# Patient Record
Sex: Female | Born: 1960 | Race: White | Hispanic: No | Smoking: Never smoker
Health system: Southern US, Community
[De-identification: ages and names within clinical notes are randomized; demographics above are authoritative.]

## PROBLEM LIST (undated history)

## (undated) DIAGNOSIS — M1990 Unspecified osteoarthritis, unspecified site: Secondary | ICD-10-CM

## (undated) DIAGNOSIS — C4491 Basal cell carcinoma of skin, unspecified: Secondary | ICD-10-CM

## (undated) DIAGNOSIS — Z889 Allergy status to unspecified drugs, medicaments and biological substances status: Secondary | ICD-10-CM

## (undated) DIAGNOSIS — Z8739 Personal history of other diseases of the musculoskeletal system and connective tissue: Secondary | ICD-10-CM

## (undated) DIAGNOSIS — E785 Hyperlipidemia, unspecified: Secondary | ICD-10-CM

## (undated) HISTORY — DX: Basal cell carcinoma of skin, unspecified: C44.91

## (undated) HISTORY — DX: Personal history of other diseases of the musculoskeletal system and connective tissue: Z87.39

## (undated) HISTORY — DX: Unspecified osteoarthritis, unspecified site: M19.90

## (undated) HISTORY — DX: Allergy status to unspecified drugs, medicaments and biological substances: Z88.9

## (undated) HISTORY — DX: Hyperlipidemia, unspecified: E78.5

---
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
## 1997-11-29 HISTORY — PX: BACK SURGERY: SHX140

## 1998-08-07 ENCOUNTER — Inpatient Hospital Stay (HOSPITAL_COMMUNITY): Admission: RE | Admit: 1998-08-07 | Discharge: 1998-08-08 | Payer: Self-pay | Admitting: Neurosurgery

## 1998-08-07 ENCOUNTER — Encounter: Payer: Self-pay | Admitting: Neurosurgery

## 2000-09-07 ENCOUNTER — Other Ambulatory Visit: Admission: RE | Admit: 2000-09-07 | Discharge: 2000-09-07 | Payer: Self-pay | Admitting: *Deleted

## 2001-11-15 ENCOUNTER — Other Ambulatory Visit: Admission: RE | Admit: 2001-11-15 | Discharge: 2001-11-15 | Payer: Self-pay | Admitting: *Deleted

## 2002-11-19 ENCOUNTER — Other Ambulatory Visit: Admission: RE | Admit: 2002-11-19 | Discharge: 2002-11-19 | Payer: Self-pay | Admitting: *Deleted

## 2004-07-20 ENCOUNTER — Other Ambulatory Visit: Admission: RE | Admit: 2004-07-20 | Discharge: 2004-07-20 | Payer: Self-pay | Admitting: Obstetrics and Gynecology

## 2005-08-06 ENCOUNTER — Other Ambulatory Visit: Admission: RE | Admit: 2005-08-06 | Discharge: 2005-08-06 | Payer: Self-pay | Admitting: Obstetrics and Gynecology

## 2007-11-30 HISTORY — PX: ABLATION: SHX5711

## 2011-11-30 HISTORY — PX: OTHER SURGICAL HISTORY: SHX169

## 2015-04-09 ENCOUNTER — Other Ambulatory Visit: Payer: Self-pay

## 2015-04-09 ENCOUNTER — Other Ambulatory Visit: Payer: Self-pay | Admitting: Obstetrics and Gynecology

## 2015-04-09 DIAGNOSIS — M25471 Effusion, right ankle: Secondary | ICD-10-CM

## 2015-04-09 DIAGNOSIS — M7989 Other specified soft tissue disorders: Secondary | ICD-10-CM

## 2015-04-10 ENCOUNTER — Ambulatory Visit
Admission: RE | Admit: 2015-04-10 | Discharge: 2015-04-10 | Disposition: A | Discharge status: Home / Self Care | Payer: 59 | Source: Ambulatory Visit | Attending: Obstetrics and Gynecology | Admitting: Obstetrics and Gynecology

## 2015-04-10 DIAGNOSIS — M7989 Other specified soft tissue disorders: Secondary | ICD-10-CM

## 2015-04-10 DIAGNOSIS — M25471 Effusion, right ankle: Secondary | ICD-10-CM

## 2015-08-12 ENCOUNTER — Other Ambulatory Visit: Payer: Self-pay | Admitting: Physician Assistant

## 2015-08-12 DIAGNOSIS — R600 Localized edema: Secondary | ICD-10-CM

## 2015-08-14 ENCOUNTER — Ambulatory Visit
Admission: RE | Admit: 2015-08-14 | Discharge: 2015-08-14 | Disposition: A | Discharge status: Home / Self Care | Payer: 59 | Source: Ambulatory Visit | Attending: Physician Assistant | Admitting: Physician Assistant

## 2015-08-14 DIAGNOSIS — R600 Localized edema: Secondary | ICD-10-CM

## 2015-08-14 MED ORDER — IOPAMIDOL (ISOVUE-300) INJECTION 61%
100.0000 mL | Freq: Once | INTRAVENOUS | Status: AC | PRN
  Administered 2015-08-14: 125 mL via INTRAVENOUS

## 2017-03-29 DIAGNOSIS — C4491 Basal cell carcinoma of skin, unspecified: Secondary | ICD-10-CM

## 2017-03-29 HISTORY — PX: BASAL CELL CARCINOMA EXCISION: SHX1214

## 2017-03-29 HISTORY — DX: Basal cell carcinoma of skin, unspecified: C44.91

## 2017-04-13 ENCOUNTER — Ambulatory Visit
Admission: RE | Admit: 2017-04-13 | Discharge: 2017-04-13 | Disposition: A | Discharge status: Home / Self Care | Payer: 59 | Source: Ambulatory Visit | Attending: Family Medicine | Admitting: Family Medicine

## 2017-04-13 ENCOUNTER — Other Ambulatory Visit: Payer: Self-pay | Admitting: Family Medicine

## 2017-04-13 DIAGNOSIS — R059 Cough, unspecified: Secondary | ICD-10-CM

## 2017-04-13 DIAGNOSIS — R05 Cough: Secondary | ICD-10-CM

## 2017-06-17 ENCOUNTER — Telehealth: Payer: Self-pay | Admitting: Emergency Medicine

## 2017-06-17 NOTE — Telephone Encounter (Signed)
Left message for Danielle Rivera to call back.

## 2017-06-20 NOTE — Telephone Encounter (Signed)
Message in error -pr

## 2017-06-29 ENCOUNTER — Ambulatory Visit (INDEPENDENT_AMBULATORY_CARE_PROVIDER_SITE_OTHER): Payer: 59 | Admitting: Pulmonary Disease

## 2017-06-29 ENCOUNTER — Encounter: Payer: Self-pay | Admitting: Pulmonary Disease

## 2017-06-29 VITALS — BP 142/88 | HR 61 | Ht 61.0 in | Wt 255.0 lb

## 2017-06-29 DIAGNOSIS — R05 Cough: Secondary | ICD-10-CM

## 2017-06-29 DIAGNOSIS — Z6841 Body Mass Index (BMI) 40.0 and over, adult: Secondary | ICD-10-CM

## 2017-06-29 DIAGNOSIS — R053 Chronic cough: Secondary | ICD-10-CM

## 2017-06-29 DIAGNOSIS — R058 Other specified cough: Secondary | ICD-10-CM

## 2017-06-29 DIAGNOSIS — R29818 Other symptoms and signs involving the nervous system: Secondary | ICD-10-CM

## 2017-06-29 MED ORDER — FLUTICASONE PROPIONATE 50 MCG/ACT NA SUSP: 1.0000 | Freq: Every day | NASAL | 2 refills | Status: DC

## 2017-06-29 MED ORDER — CHLORPHENIRAMINE MALEATE 4 MG PO TABS: 4.0000 mg | ORAL_TABLET | Freq: Every day | ORAL | 0 refills | Status: DC

## 2017-06-29 NOTE — Progress Notes (Signed)
Past surgical history She  has a past surgical history that includes Back surgery (1999); Tonsillectomy (1970); Excision basal cell carcinoma (03/2017); Tummy Tuck (2013); and Ablation (2009).  Family history Her family history includes Breast cancer in her cousin; Diabetes in her mother; Heart disease in her maternal grandmother; Hypertension in her father and mother; Uterine cancer in her mother.  Social history She  reports that she has never smoked. She has never used smokeless tobacco. She reports that she drinks alcohol. She reports that she does not use drugs.  Allergies  Allergen Reactions  . Codeine Nausea Only   Review of Systems  Constitutional: Positive for unexpected weight change. Negative for fever.  HENT: Positive for congestion. Negative for dental problem, ear pain, nosebleeds, postnasal drip, rhinorrhea, sinus pressure, sneezing, sore throat and trouble swallowing.   Eyes: Negative for redness and itching.  Respiratory: Positive for cough and shortness of breath. Negative for chest tightness and wheezing.   Cardiovascular: Negative for palpitations and leg swelling.  Gastrointestinal: Negative for nausea and vomiting.  Genitourinary: Negative for dysuria.  Musculoskeletal: Negative for joint swelling.  Skin: Negative for rash.  Neurological: Positive for headaches.  Hematological: Does not bruise/bleed easily.  Psychiatric/Behavioral: Negative for dysphoric mood. The patient is not nervous/anxious.    No current outpatient prescriptions on file prior to visit.   No current facility-administered medications on file prior to visit.     Chief Complaint  Patient presents with  . PULMONARY CONSULT    Referred by Dr Marisue Humble for cough. Cough present x 6 months, pt reports cough being mostly dry with little mucus     Past medical history She  has a past medical history of Basal cell carcinoma (03/2017); H/O seasonal allergies; and Hyperlipidemia.  Vital signs BP  (!) 142/88 (BP Location: Left Arm, Cuff Size: Normal)   Pulse 61   Ht 5\' 1"  (1.549 m)   Wt 255 lb (115.7 kg)   SpO2 95%   BMI 48.18 kg/m   History of present illness Danielle Rivera is a 56 y.o. female never smoker with chronic cough.  Her symptoms started about 6 months ago.  She isn't aware of any particular inciting event.  She has been getting post nasal drip.  She gets throat irritation and feels like something is stuck in her throat.  She tries to clear her throat or cough, but nothing will come up.  She does get allergies with change of seasons.  She has not had allergy testing before.  She was never told that she had asthma.  She was tried on an albuterol inhaler, but this didn't help.  She was tried on reflux therapy, but no improvement.  She hasn't been tried on nasal sprays.  She did try an OTC antihistamine and this helped some.  She has not had breathing tests before.  She did have her blood pressure medication changed recently (?ACE inhibitor), and she feels her cough and throat irritation have improved some since this was stopped.  She gets wheezing, but this comes from her upper chest and throat area.  She has noticed trouble with her breathing while asleep.  She snores, and her boyfriend has told her that her breathing cuts off at times while she is asleep.  She has trouble staying awake when watching TV.  She is a restless sleeper.  Her Epworth sleepiness score is 10 out of 24.  Physical exam  General - No distress Eyes - pupils reactive ENT - No  sinus tenderness, no oral exudate, no LAN, no thyromegaly, TM clear, pupils equal/reactive, raspy voice, frequent throat clearing, MP 4, scalloped tongue Cardiac - s1s2 regular, no murmur, pulses symmetric Chest - No wheeze/rales/dullness, good air entry, normal respiratory excursion Back - No focal tenderness Abd - Soft, non-tender, no organomegaly, + bowel sounds Ext - No edema Neuro - Normal strength, cranial nerves intact Skin  - No rashes Psych - Normal mood, and behavior   CXR 04/13/17 >> no active disease   Discussion She has persistent cough.  She is a never smoker and had normal chest xray in May 2018.  She has been tried on asthma therapy and antireflux therapy w/o improvement.  She has symptoms of sinus congestion and post nasal drip, and likely has upper airway cough syndrome as a result of this.  She also reports having some improvement in her cough after her blood pressure medication was stopped >> ?if this was an ACE inhibitor.  Finally, she has snoring, sleep disruption, witnessed apnea, and daytime sleepiness.  She could have obstructive sleep apnea.  I explained how sleep apnea could contribute throat irritation and nocturnal cough.  We also discussed how sleep apnea can affect various health problems, including risks for hypertension, cardiovascular disease, and diabetes.  We also discussed how sleep disruption can increase risks for accidents, such as while driving.  Weight loss as a means of improving sleep apnea was also reviewed.   Assessment/plan  Upper airway cough syndrome. - nasal irrigation followed by flonase daily - chlorpheniramine 4 mg nightly - salt water gargles bid - sip water with urge to cough - sugarless candy to keep mouth moist - avoid forcing cough, or clearing throat - voice rest - will arrange for pulmonary function testing - albuterol prn  ?ACE inhibitor cough. - explained that we need at least 4 to 6 weeks off medication to determine how much this could be contributing  Suspected sleep apnea. - will discuss further at next visit about arranging for home sleep study  Obesity. - discussed importance of weight loss   Patient Instructions  Saline nasal spray daily Flonase one spray each nostril daily Chlorpheniramine 4 mg pill nightly until cough is better, then as needed Salt water gargle twice per day Sip water when you have urge to cough Use sugarless candy to  keep your mouth moist Avoid forcing cough or clearing your throat Will arrange for pulmonary function testing  Follow up in 4 weeks with Dr. Halford Chessman or Nurse Practitioner   Chesley Mires, MD [place] Pager:  [phone] 06/29/2017, 12:39 PM

## 2017-06-29 NOTE — Progress Notes (Signed)
   Subjective:    Patient ID: Danielle Rivera, female    DOB: 26-Jul-1961, 56 y.o.   MRN: [id number]  HPI    Review of Systems  Constitutional: Positive for unexpected weight change. Negative for fever.  HENT: Positive for congestion. Negative for dental problem, ear pain, nosebleeds, postnasal drip, rhinorrhea, sinus pressure, sneezing, sore throat and trouble swallowing.   Eyes: Negative for redness and itching.  Respiratory: Positive for cough and shortness of breath. Negative for chest tightness and wheezing.   Cardiovascular: Negative for palpitations and leg swelling.  Gastrointestinal: Negative for nausea and vomiting.  Genitourinary: Negative for dysuria.  Musculoskeletal: Negative for joint swelling.  Skin: Negative for rash.  Neurological: Positive for headaches.  Hematological: Does not bruise/bleed easily.  Psychiatric/Behavioral: Negative for dysphoric mood. The patient is not nervous/anxious.        Objective:   Physical Exam        Assessment & Plan:

## 2017-06-29 NOTE — Patient Instructions (Signed)
Saline nasal spray daily Flonase one spray each nostril daily Chlorpheniramine 4 mg pill nightly until cough is better, then as needed Salt water gargle twice per day Sip water when you have urge to cough Use sugarless candy to keep your mouth moist Avoid forcing cough or clearing your throat Will arrange for pulmonary function testing  Follow up in 4 weeks with Dr. Halford Chessman or Nurse Practitioner

## 2017-08-04 ENCOUNTER — Encounter: Payer: Self-pay | Admitting: Adult Health

## 2017-08-04 ENCOUNTER — Ambulatory Visit (INDEPENDENT_AMBULATORY_CARE_PROVIDER_SITE_OTHER): Payer: 59 | Admitting: Pulmonary Disease

## 2017-08-04 ENCOUNTER — Ambulatory Visit (INDEPENDENT_AMBULATORY_CARE_PROVIDER_SITE_OTHER): Payer: 59 | Admitting: Adult Health

## 2017-08-04 VITALS — BP 116/78 | HR 77 | Ht 61.5 in | Wt 252.0 lb

## 2017-08-04 DIAGNOSIS — G4733 Obstructive sleep apnea (adult) (pediatric): Secondary | ICD-10-CM

## 2017-08-04 DIAGNOSIS — R05 Cough: Secondary | ICD-10-CM

## 2017-08-04 DIAGNOSIS — R053 Chronic cough: Secondary | ICD-10-CM

## 2017-08-04 DIAGNOSIS — R400 Somnolence: Secondary | ICD-10-CM | POA: Diagnosis not present

## 2017-08-04 LAB — PULMONARY FUNCTION TEST
DL/VA % PRED: 132 %
DL/VA: 5.92 ml/min/mmHg/L
DLCO cor % pred: 105 %
DLCO cor: 22.07 ml/min/mmHg
DLCO unc % pred: 112 %
DLCO unc: 23.43 ml/min/mmHg
FEF 25-75 Post: 4.08 L/sec
FEF 25-75 Pre: 3.32 L/sec
FEF2575-%Change-Post: 22 %
FEF2575-%PRED-POST: 169 %
FEF2575-%Pred-Pre: 138 %
FEV1-%CHANGE-POST: 8 %
FEV1-%PRED-PRE: 87 %
FEV1-%Pred-Post: 94 %
FEV1-PRE: 2.13 L
FEV1-Post: 2.31 L
FEV1FVC-%Change-Post: 8 %
FEV1FVC-%PRED-PRE: 109 %
FEV6-%Change-Post: 0 %
FEV6-%PRED-POST: 81 %
FEV6-%PRED-PRE: 81 %
FEV6-POST: 2.46 L
FEV6-PRE: 2.46 L
FEV6FVC-%PRED-POST: 103 %
FEV6FVC-%PRED-PRE: 103 %
FVC-%CHANGE-POST: 0 %
FVC-%PRED-PRE: 78 %
FVC-%Pred-Post: 78 %
FVC-POST: 2.46 L
FVC-PRE: 2.46 L
PRE FEV6/FVC RATIO: 100 %
Post FEV1/FVC ratio: 94 %
Post FEV6/FVC ratio: 100 %
Pre FEV1/FVC ratio: 87 %
RV % PRED: 77 %
RV: 1.38 L
TLC % pred: 85 %
TLC: 4.02 L

## 2017-08-04 MED ORDER — BENZONATATE 200 MG PO CAPS: 200.0000 mg | ORAL_CAPSULE | Freq: Three times a day (TID) | ORAL | 3 refills | Status: DC | PRN

## 2017-08-04 NOTE — Progress Notes (Signed)
@Patient  ID: Danielle Rivera, female    DOB: [birth year], 56 y.o.   MRN: [id number]  Chief Complaint  Patient presents with  . Follow-up    Cough    Referring provider: Mayra Neer, MD  HPI: 56 year old female never smoker seen for pulmonary consult 06/29/2017 for chronic cough 6 month and daytime sleepiness  08/04/2017 Follow up : Cough /Daytime sleepiness  Patient presents for a one-month follow-up. Patient was seen last visit for persistent cough 6 months. She was changed off of her ACE inhibitor prior to her visit. Patient was recommended to use Chlor-Trimeton at bedtime. Begin Flonase daily. She underwent pulmonary function test today that showed normal lung function with an FEV1 at 94%, ratio 94, FVC 78%, DLCO 112%. No significant bronchodilator response. Chest x-ray showed clear lungs in May 2018 Since last visit. Patient feels that her cough is not much better. Taking Chlortrimeton daily . Uses Flonase occasionally does not like it running down her throat.  Has throat clearing constantly . Not using anything for cough .  Prior to consult she took 2 courses of prednisone and 4 weeks of Prilosec. Tried inhaler in past as well with no perceived benefit.   She does have daytime sleepiness ,snoring , and interrupted sleep . We discussed possible OSA . She agrees to proceed with sleep study .      Allergies  Allergen Reactions  . Codeine Nausea Only    Immunization History  Administered Date(s) Administered  . Influenza,inj,Quad PF,6+ Mos 08/29/2016    Past Medical History:  Diagnosis Date  . Basal cell carcinoma 03/2017  . H/O seasonal allergies   . Hyperlipidemia     Tobacco History: History  Smoking Status  . Never Smoker  Smokeless Tobacco  . Never Used   Counseling given: Not Answered   Outpatient Encounter Prescriptions as of 08/04/2017  Medication Sig  . amLODipine (NORVASC) 5 MG tablet Take 5 mg by mouth daily.  . chlorpheniramine (CHLOR-TRIMETON) 4  MG tablet Take 1 tablet (4 mg total) by mouth at bedtime.  . fluticasone (FLONASE) 50 MCG/ACT nasal spray Place 1 spray into both nostrils daily.  Marland Kitchen lovastatin (MEVACOR) 20 MG tablet Take 20 mg by mouth at bedtime.  . mirabegron ER (MYRBETRIQ) 50 MG TB24 tablet Take 50 mg by mouth daily.  Marland Kitchen oxybutynin (DITROPAN) 5 MG tablet Take 1 tablet by mouth 2 (two) times daily.   Marland Kitchen triamterene-hydrochlorothiazide (DYAZIDE) 37.5-25 MG capsule Take 1 capsule by mouth daily.  . benzonatate (TESSALON) 200 MG capsule Take 1 capsule (200 mg total) by mouth 3 (three) times daily as needed for cough.  Marland Kitchen omeprazole (PRILOSEC) 20 MG capsule Take 1 capsule by mouth daily.   No facility-administered encounter medications on file as of 08/04/2017.      Review of Systems  Constitutional:   No  weight loss, night sweats,  Fevers, chills, + fatigue, or  lassitude.  HEENT:   No headaches,  Difficulty swallowing,  Tooth/dental problems, or  Sore throat,                No sneezing, itching, ear ache, +nasal congestion, post nasal drip,   CV:  No chest pain,  Orthopnea, PND, swelling in lower extremities, anasarca, dizziness, palpitations, syncope.   GI  No heartburn, indigestion, abdominal pain, nausea, vomiting, diarrhea, change in bowel habits, loss of appetite, bloody stools.   Resp:   No chest wall deformity  Skin: no rash or lesions.  GU: no dysuria, change  in color of urine, no urgency or frequency.  No flank pain, no hematuria   MS:  No joint pain or swelling.  No decreased range of motion.  No back pain.    Physical Exam  BP 116/78 (BP Location: Left Arm)   Pulse 77   Ht 5' 1.5" (1.562 m)   Wt 252 lb (114.3 kg)   SpO2 95%   BMI 46.84 kg/m   GEN: A/Ox3; pleasant , NAD, obese    HEENT:  [place]/AT,  EACs-clear, TMs-wnl, NOSE-clear, THROAT-clear, no lesions, no postnasal drip or exudate noted. Class 2-3 MP airway   NECK:  Supple w/ fair ROM; no JVD; normal carotid impulses w/o bruits; no  thyromegaly or nodules palpated; no lymphadenopathy.    RESP  Clear  P & A; w/o, wheezes/ rales/ or rhonchi. no accessory muscle use, no dullness to percussion  CARD:  RRR, no m/r/g, no peripheral edema, pulses intact, no cyanosis or clubbing.  GI:   Soft & nt; nml bowel sounds; no organomegaly or masses detected.   Musco: Warm bil, no deformities or joint swelling noted.   Neuro: alert, no focal deficits noted.    Skin: Warm, no lesions or rashes    Lab Results:  CBC No results found for: WBC, RBC, HGB, HCT, PLT, MCV, MCH, MCHC, RDW, LYMPHSABS, MONOABS, EOSABS, BASOSABS  BMET No results found for: NA, K, CL, CO2, GLUCOSE, BUN, CREATININE, CALCIUM, GFRNONAA, GFRAA  BNP No results found for: BNP  ProBNP No results found for: PROBNP  Imaging: No results found.   Assessment & Plan:   Chronic cough Cyclical cough x 6 months with neg CXR , normal PFT  ? ACE related cough with AR/GERD triggers.  Some improvement off ACE but persistent dry cough and throat clearing . Will treat for triggers along with cough control meds.   Plan  . Patient Instructions  Begin Delsym 2 tsp Twice daily  For cough , As needed   Begin Tessalon Three times a day  For cough , As needed   Begin Zyrtec 10mg  daily .  Continue on Chlortrimeton 4mg  At bedtime.  Begin Prilosec 20mg  .daily before meal .  Begin Pepcid 20mg  At bedtime  .  Work on not coughing or throat clearing , use sips of water to soothe throat .  Do not use MINTS.  Set up Home Sleep Study .  Follow up Dr. Halford Chessman  In 6-8 weeks and As needed   Please contact office for sooner follow up if symptoms do not improve or worsen or seek emergency care           Daytime sleepiness Daytime hypersolmonence - snoring and obesity  Will check Home sleep study to r/o OSA     Rexene Edison, NP 08/04/2017

## 2017-08-04 NOTE — Assessment & Plan Note (Signed)
Cyclical cough x 6 months with neg CXR , normal PFT  ? ACE related cough with AR/GERD triggers.  Some improvement off ACE but persistent dry cough and throat clearing . Will treat for triggers along with cough control meds.   Plan  . Patient Instructions  Begin Delsym 2 tsp Twice daily  For cough , As needed   Begin Tessalon Three times a day  For cough , As needed   Begin Zyrtec 10mg  daily .  Continue on Chlortrimeton 4mg  At bedtime.  Begin Prilosec 20mg  .daily before meal .  Begin Pepcid 20mg  At bedtime  .  Work on not coughing or throat clearing , use sips of water to soothe throat .  Do not use MINTS.  Set up Home Sleep Study .  Follow up Dr. Halford Chessman  In 6-8 weeks and As needed   Please contact office for sooner follow up if symptoms do not improve or worsen or seek emergency care

## 2017-08-04 NOTE — Patient Instructions (Addendum)
Begin Delsym 2 tsp Twice daily  For cough , As needed   Begin Tessalon Three times a day  For cough , As needed   Begin Zyrtec 10mg  daily .  Continue on Chlortrimeton 4mg  At bedtime.  Begin Prilosec 20mg  .daily before meal .  Begin Pepcid 20mg  At bedtime  .  Work on not coughing or throat clearing , use sips of water to soothe throat .  Do not use MINTS.  Set up Home Sleep Study .  Follow up Dr. Halford Chessman  In 6-8 weeks and As needed   Please contact office for sooner follow up if symptoms do not improve or worsen or seek emergency care

## 2017-08-04 NOTE — Assessment & Plan Note (Signed)
Daytime hypersolmonence - snoring and obesity  Will check Home sleep study to r/o OSA

## 2017-08-04 NOTE — Progress Notes (Signed)
PFT done today. 

## 2017-08-05 NOTE — Progress Notes (Signed)
I have reviewed and agree with assessment/plan.  Chesley Mires, MD [facility] Pulmonary/Critical Care 08/05/2017, 11:49 AM Pager:  [phone]

## 2017-08-10 ENCOUNTER — Institutional Professional Consult (permissible substitution): Payer: 59 | Admitting: Emergency Medicine

## 2018-05-22 ENCOUNTER — Other Ambulatory Visit: Payer: Self-pay | Admitting: Physician Assistant

## 2018-05-22 DIAGNOSIS — N6310 Unspecified lump in the right breast, unspecified quadrant: Secondary | ICD-10-CM

## 2018-05-26 ENCOUNTER — Ambulatory Visit: Payer: 59

## 2018-05-26 ENCOUNTER — Ambulatory Visit
Admission: RE | Admit: 2018-05-26 | Discharge: 2018-05-26 | Disposition: A | Discharge status: Home / Self Care | Payer: 59 | Source: Ambulatory Visit | Attending: Physician Assistant | Admitting: Physician Assistant

## 2018-05-26 DIAGNOSIS — N6310 Unspecified lump in the right breast, unspecified quadrant: Secondary | ICD-10-CM

## 2019-12-07 IMAGING — MG DIGITAL DIAGNOSTIC UNILATERAL RIGHT MAMMOGRAM WITH TOMO AND CAD
6 series · 6 of 18 positions shown · non-contrast
Comparison: Previous exam(s).

CLINICAL DATA: 57-year-old female with 2 palpable areas in the
medial right breast. The patient recently had a fall on 04/29/2018
with bruising in this area.

EXAM:
DIGITAL DIAGNOSTIC UNILATERAL RIGHT MAMMOGRAM WITH CAD AND TOMO

[R CC synth-2D (1 of 2)]
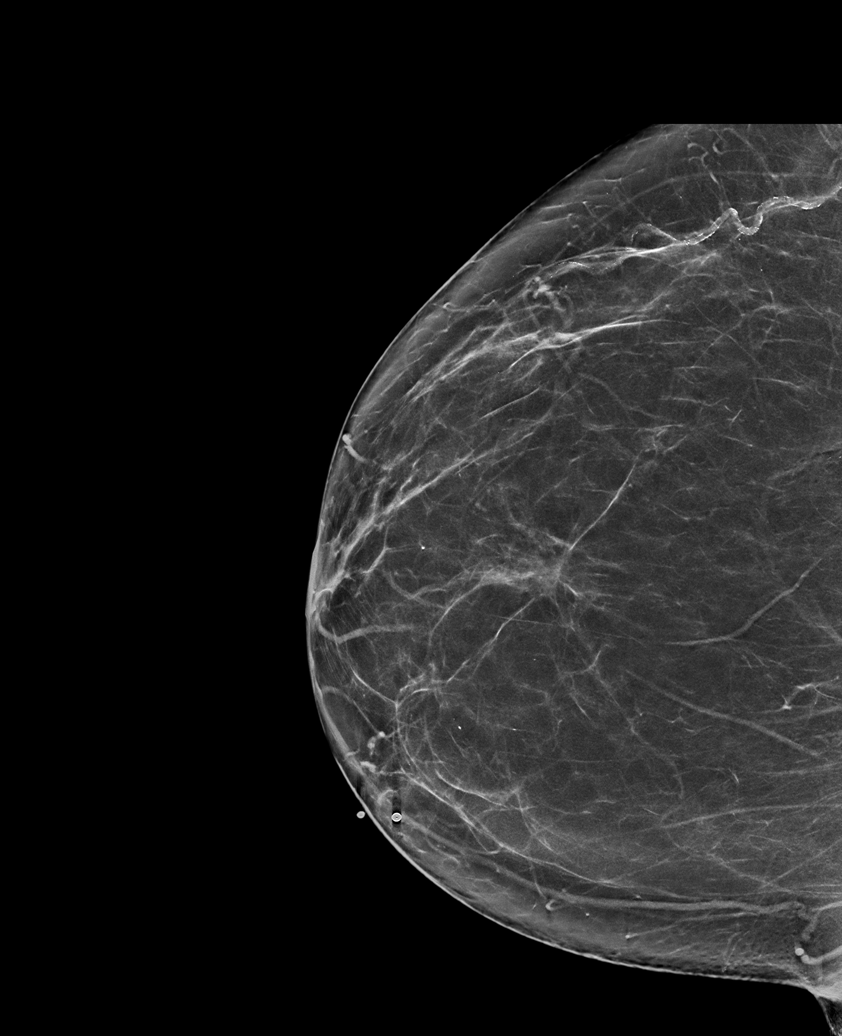

[R MLO synth-2D]
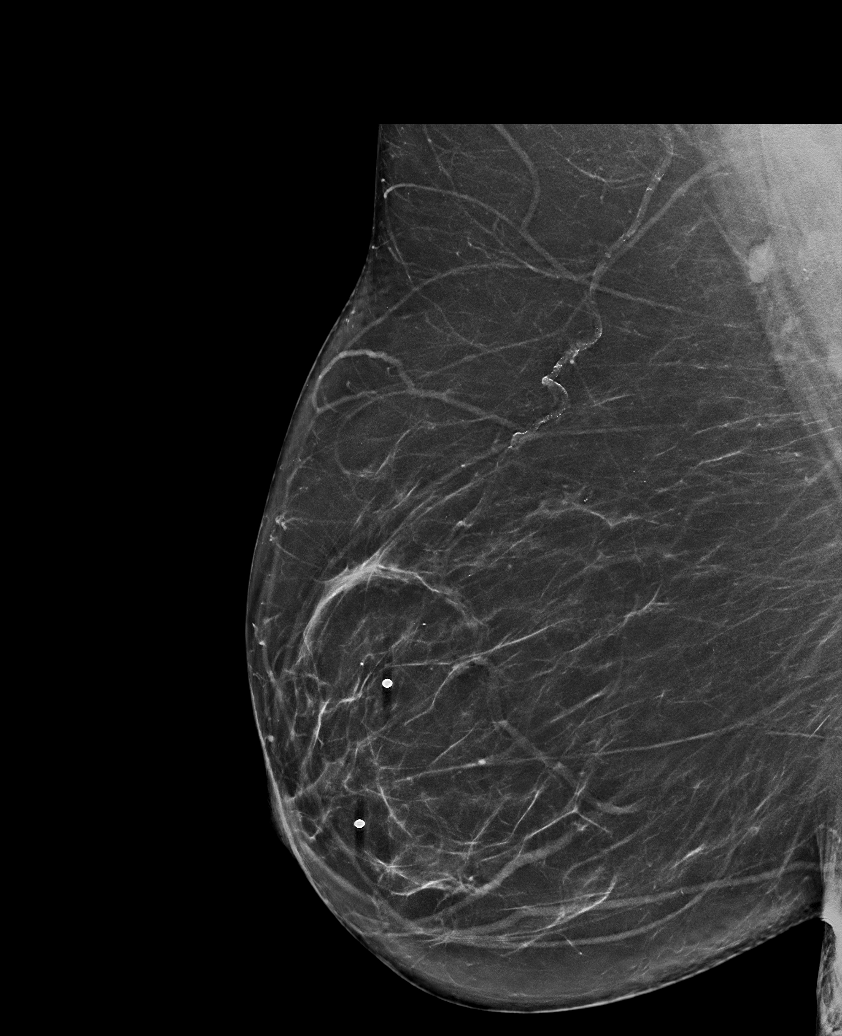

[R CC synth-2D (2 of 2)]
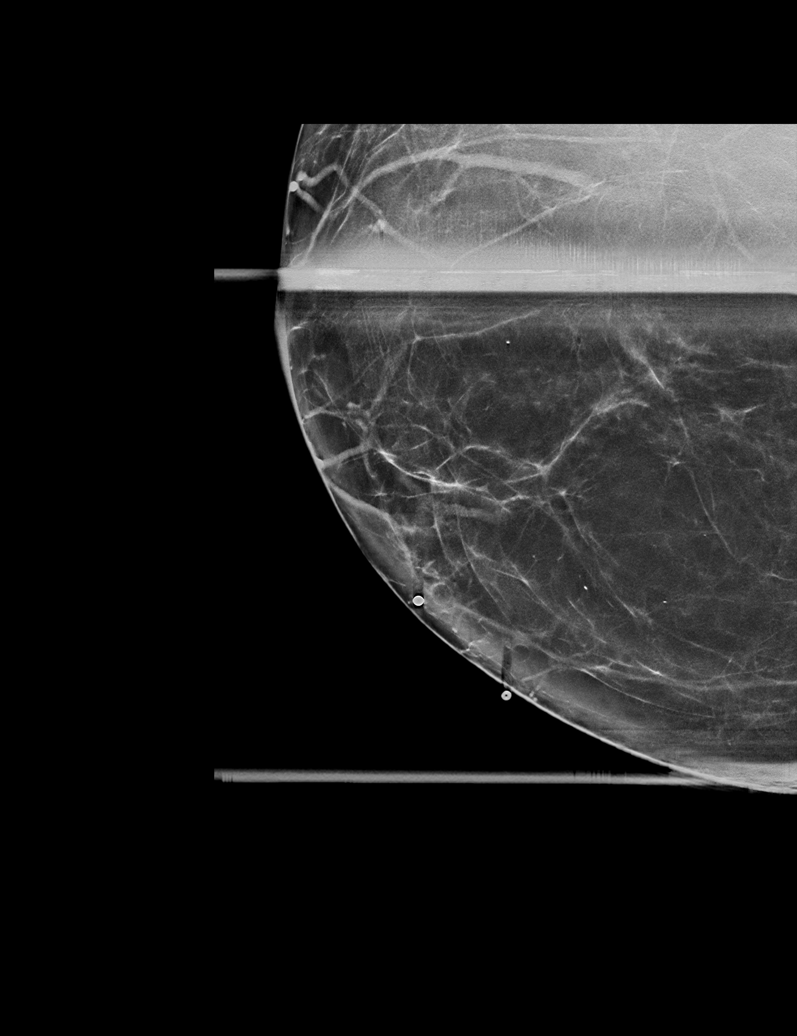

[R CC tomo (1 of 2) · tomo slice 35/69.0]
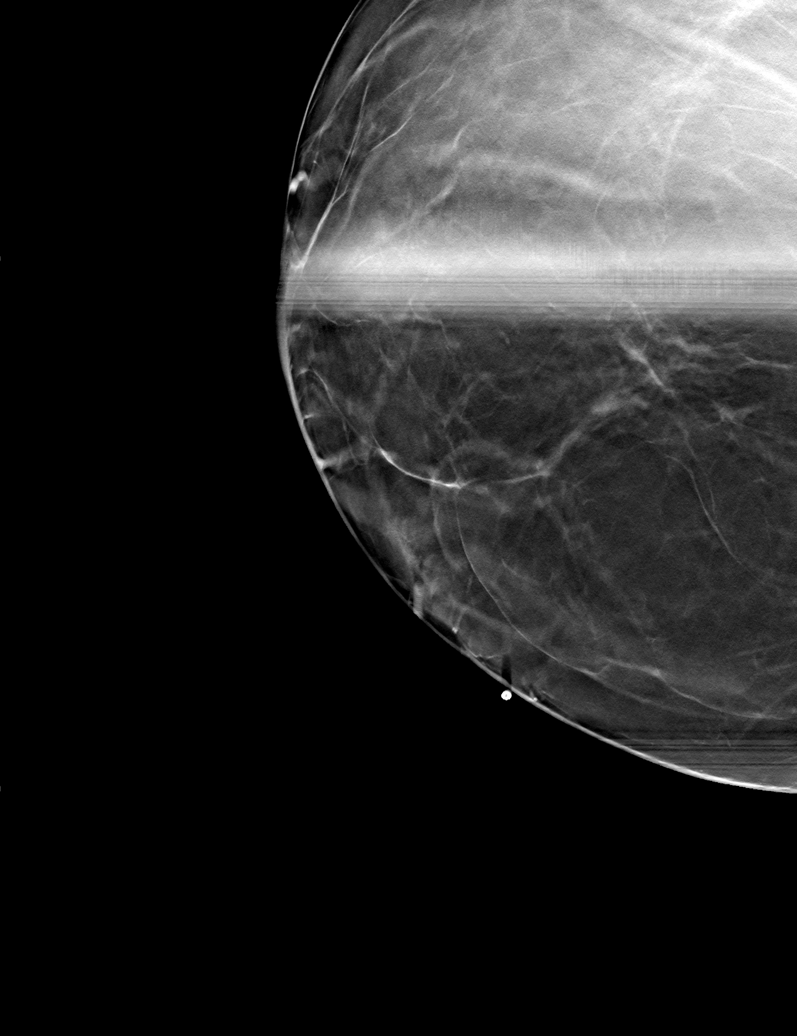

[R MLO tomo · tomo slice 44/87.0]
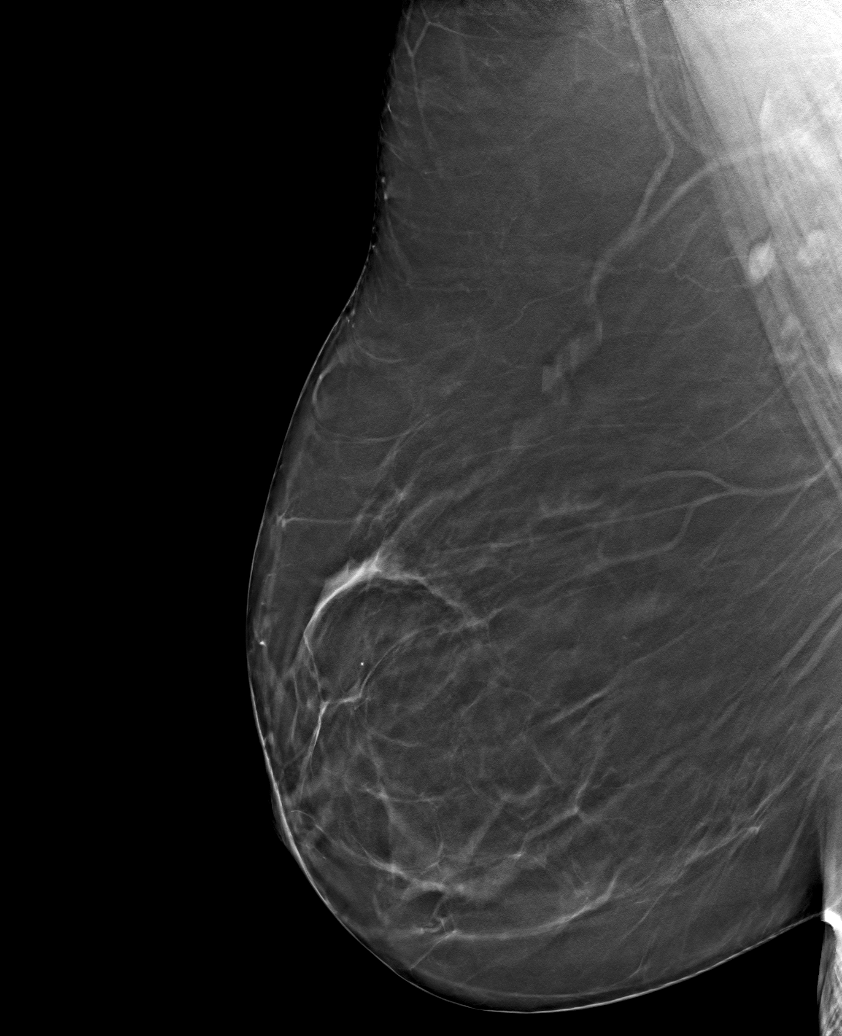

[R CC tomo (2 of 2) · tomo slice 41/80.0]
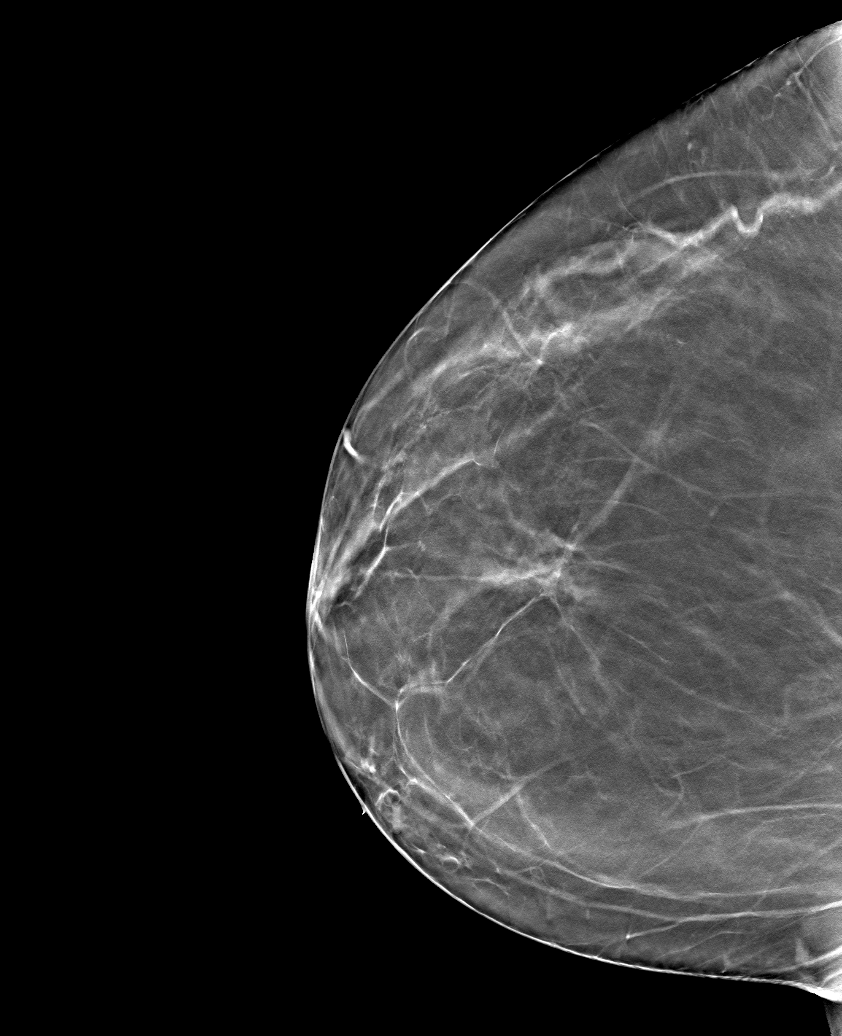

[6 of 18 positions shown; findings below may reference images not displayed]

ACR Breast Density Category b: There are scattered areas of
fibroglandular density.
FINDINGS: Two BBs have been placed along the medial aspect of the right
breast. Spot tangential been tomosynthesis imaging through this
region demonstrates that there are 2 small superficial oil cysts
immediately deep to the palpable markers. The appearance is
consistent with the patient's history of recent trauma to the
breast. No other suspicious calcifications, masses or areas of
distortion are seen in the right breast.

Mammographic images were processed with CAD.
IMPRESSION: The 2 palpable areas in the medial right breast correspond with
benign fat necrosis.

RECOMMENDATION:
Return to routine screening mammography is recommended. The patient
will be due for screening in Tuesday May, 2018.

I have discussed the findings and recommendations with the patient.
Results were also provided in writing at the conclusion of the
visit. If applicable, a reminder letter will be sent to the patient
regarding the next appointment.

BI-RADS CATEGORY  2: Benign.

## 2020-01-15 ENCOUNTER — Other Ambulatory Visit: Payer: Self-pay | Admitting: Obstetrics and Gynecology

## 2020-01-15 DIAGNOSIS — R928 Other abnormal and inconclusive findings on diagnostic imaging of breast: Secondary | ICD-10-CM

## 2020-02-12 ENCOUNTER — Ambulatory Visit
Admission: RE | Admit: 2020-02-12 | Discharge: 2020-02-12 | Disposition: A | Discharge status: Home / Self Care | Payer: No Typology Code available for payment source | Source: Ambulatory Visit | Attending: Obstetrics and Gynecology | Admitting: Obstetrics and Gynecology

## 2020-02-12 ENCOUNTER — Other Ambulatory Visit: Payer: Self-pay

## 2020-02-12 DIAGNOSIS — R928 Other abnormal and inconclusive findings on diagnostic imaging of breast: Secondary | ICD-10-CM

## 2020-08-27 ENCOUNTER — Other Ambulatory Visit: Payer: Self-pay | Admitting: Family Medicine

## 2020-08-27 ENCOUNTER — Ambulatory Visit
Admission: RE | Admit: 2020-08-27 | Discharge: 2020-08-27 | Disposition: A | Discharge status: Home / Self Care | Payer: No Typology Code available for payment source | Source: Ambulatory Visit | Attending: Family Medicine | Admitting: Family Medicine

## 2020-08-27 DIAGNOSIS — R059 Cough, unspecified: Secondary | ICD-10-CM

## 2020-11-13 ENCOUNTER — Other Ambulatory Visit: Payer: Self-pay

## 2020-11-13 ENCOUNTER — Encounter: Payer: Self-pay | Admitting: Pulmonary Disease

## 2020-11-13 ENCOUNTER — Ambulatory Visit (INDEPENDENT_AMBULATORY_CARE_PROVIDER_SITE_OTHER): Payer: No Typology Code available for payment source | Admitting: Pulmonary Disease

## 2020-11-13 VITALS — BP 122/80 | HR 69 | Temp 98.1°F | Ht 61.0 in | Wt 258.5 lb

## 2020-11-13 DIAGNOSIS — Z6841 Body Mass Index (BMI) 40.0 and over, adult: Secondary | ICD-10-CM | POA: Diagnosis not present

## 2020-11-13 DIAGNOSIS — J383 Other diseases of vocal cords: Secondary | ICD-10-CM

## 2020-11-13 DIAGNOSIS — R053 Chronic cough: Secondary | ICD-10-CM | POA: Diagnosis not present

## 2020-11-13 NOTE — Progress Notes (Signed)
Synopsis: Referred in dec 2021 for cough by Mayra Neer, MD  Subjective:   PATIENT ID: Danielle Rivera GENDER: female DOB: [birth year], MRN: [id number]  Chief Complaint  Patient presents with  . Consult    Cough x 2 years.  Wheezing worse at night. Exertion makes the cough worse.  She has been staying at home due to the cough and sometimes she coughs so hard she wets herself.     Patient presents today to the office with complaints of chronic cough as described below.  An additional history taking patient's cough really started flaring up after a emotional event where her sister, removed the patient's mom from living with her in their life.  She has not seen her mother in several years.  When talking about this patient's cough also was much worse here in the office.  When she was able to calm down and [place] to talk about this a little bit more the cough, resolved and dissipated.  After discussing this with her it seems to be very situational.  Can be stimulated with emotion and exertion.  This has been going on for greater than 3+ years.  When she describes it she points directly at her throat feeling like she has the inability to clear and breathe appropriately.  She does occasionally hear a wheeze.  Cough This is a chronic problem. The current episode started more than 1 year ago. The problem has been gradually worsening. The problem occurs every few minutes. The cough is non-productive. Associated symptoms include chest pain and a sore throat. Pertinent negatives include no chills, fever, headaches, heartburn, hemoptysis, myalgias, rash, shortness of breath, weight loss or wheezing. Associated symptoms comments: Occasionally causes her to be incontinent or urine or stool . Exacerbated by: walking, exerting. She has tried prescription cough suppressant, OTC cough suppressant and oral steroids for the symptoms. The treatment provided mild relief. Her past medical history is significant for  asthma, bronchitis and environmental allergies.     Past Medical History:  Diagnosis Date  . Basal cell carcinoma 03/2017  . H/O seasonal allergies   . Hyperlipidemia      Family History  Problem Relation Age of Onset  . Uterine cancer Mother   . Hypertension Mother   . Diabetes Mother   . Heart disease Maternal Grandmother   . Breast cancer Cousin   . Hypertension Father   . Breast cancer Cousin      Past Surgical History:  Procedure Laterality Date  . ABLATION  2009  . BACK SURGERY  1999   ruptured disc repair  . BASAL CELL CARCINOMA EXCISION  03/2017   left bridge nose  . TONSILLECTOMY  1970  . Tummy Tuck  2013    Social History   Socioeconomic History  . Marital status: Divorced    Spouse name: Not on file  . Number of children: Not on file  . Years of education: Not on file  . Highest education level: Not on file  Occupational History  . Occupation: Insurance underwriter  Tobacco Use  . Smoking status: Never Smoker  . Smokeless tobacco: Never Used  Vaping Use  . Vaping Use: Never used  Substance and Sexual Activity  . Alcohol use: Yes    Comment: occassional on weekends  . Drug use: No  . Sexual activity: Not on file  Other Topics Concern  . Not on file  Social History Narrative  . Not on file   Social Determinants of Health  Financial Resource Strain: Not on file  Food Insecurity: Not on file  Transportation Needs: Not on file  Physical Activity: Not on file  Stress: Not on file  Social Connections: Not on file  Intimate Partner Violence: Not on file     Allergies  Allergen Reactions  . Codeine Nausea Only     Outpatient Medications Prior to Visit  Medication Sig Dispense Refill  . chlorpheniramine (CHLOR-TRIMETON) 4 MG tablet Take 1 tablet (4 mg total) by mouth at bedtime. 14 tablet 0  . lovastatin (MEVACOR) 40 MG tablet Take 40 mg by mouth daily.    Marland Kitchen triamterene-hydrochlorothiazide (DYAZIDE) 37.5-25 MG capsule Take 1 capsule by mouth daily.     . verapamil (CALAN-SR) 240 MG CR tablet Take 240 mg by mouth daily.    Marland Kitchen amLODipine (NORVASC) 5 MG tablet Take 5 mg by mouth daily. (Patient not taking: Reported on 11/13/2020)    . benzonatate (TESSALON) 200 MG capsule Take 1 capsule (200 mg total) by mouth 3 (three) times daily as needed for cough. (Patient not taking: Reported on 11/13/2020) 45 capsule 3  . fluticasone (FLONASE) 50 MCG/ACT nasal spray Place 1 spray into both nostrils daily. (Patient not taking: Reported on 11/13/2020) 16 g 2  . lovastatin (MEVACOR) 20 MG tablet Take 20 mg by mouth at bedtime. (Patient not taking: Reported on 11/13/2020)    . mirabegron ER (MYRBETRIQ) 50 MG TB24 tablet Take 50 mg by mouth daily. (Patient not taking: Reported on 11/13/2020)    . omeprazole (PRILOSEC) 20 MG capsule Take 1 capsule by mouth daily. (Patient not taking: Reported on 11/13/2020)    . oxybutynin (DITROPAN) 5 MG tablet Take 1 tablet by mouth 2 (two) times daily.  (Patient not taking: Reported on 11/13/2020)     No facility-administered medications prior to visit.    Review of Systems  Constitutional: Negative for chills, fever, malaise/fatigue and weight loss.  HENT: Positive for sore throat. Negative for hearing loss and tinnitus.   Eyes: Negative for blurred vision and double vision.  Respiratory: Positive for cough. Negative for hemoptysis, sputum production, shortness of breath, wheezing and stridor.   Cardiovascular: Positive for chest pain. Negative for palpitations, orthopnea, leg swelling and PND.  Gastrointestinal: Negative for abdominal pain, constipation, diarrhea, heartburn, nausea and vomiting.  Genitourinary: Negative for dysuria, hematuria and urgency.  Musculoskeletal: Negative for joint pain and myalgias.  Skin: Negative for itching and rash.  Neurological: Negative for dizziness, tingling, weakness and headaches.  Endo/Heme/Allergies: Positive for environmental allergies. Does not bruise/bleed easily.   Psychiatric/Behavioral: Negative for depression. The patient is not nervous/anxious and does not have insomnia.   All other systems reviewed and are negative.    Objective:  Physical Exam Vitals reviewed.  Constitutional:      General: She is not in acute distress.    Appearance: She is well-developed and well-nourished. She is obese.  HENT:     Head: Normocephalic and atraumatic.     Mouth/Throat:     Mouth: Oropharynx is clear and moist.  Eyes:     General: No scleral icterus.    Conjunctiva/sclera: Conjunctivae normal.     Pupils: Pupils are equal, round, and reactive to light.  Neck:     Vascular: No JVD.     Trachea: No tracheal deviation.  Cardiovascular:     Rate and Rhythm: Normal rate and regular rhythm.     Pulses: Intact distal pulses.     Heart sounds: Normal heart sounds. No murmur heard.  Pulmonary:     Effort: Pulmonary effort is normal. No tachypnea, accessory muscle usage or respiratory distress.     Breath sounds: Normal breath sounds. No stridor. No wheezing, rhonchi or rales.  Abdominal:     General: Bowel sounds are normal. There is no distension.     Palpations: Abdomen is soft.     Tenderness: There is no abdominal tenderness.  Musculoskeletal:        General: No tenderness or edema.     Cervical back: Neck supple.  Lymphadenopathy:     Cervical: No cervical adenopathy.  Skin:    General: Skin is warm and dry.     Capillary Refill: Capillary refill takes less than 2 seconds.     Findings: No rash.  Neurological:     Mental Status: She is alert and oriented to person, place, and time.  Psychiatric:        Mood and Affect: Mood and affect normal.        Behavior: Behavior normal.      Vitals:   11/13/20 1520  BP: 122/80  Pulse: 69  Temp: 98.1 F (36.7 C)  TempSrc: Tympanic  SpO2: 97%  Weight: 258 lb 8 oz (117.3 kg)  Height: 5\' 1"  (1.549 m)   97% on  RA BMI Readings from Last 3 Encounters:  11/13/20 48.84 kg/m  08/04/17 46.84  kg/m  06/29/17 48.18 kg/m   Wt Readings from Last 3 Encounters:  11/13/20 258 lb 8 oz (117.3 kg)  08/04/17 252 lb (114.3 kg)  06/29/17 255 lb (115.7 kg)     CBC No results found for: WBC, RBC, HGB, HCT, PLT, MCV, MCH, MCHC, RDW, LYMPHSABS, MONOABS, EOSABS, BASOSABS    Chest Imaging:  08/27/2020 chest x-ray: Clear no infiltrate. The patient's images have been independently reviewed by me.     Pulmonary Functions Testing Results: PFT Results Latest Ref Rng & Units 08/04/2017  FVC-Pre L 2.46  FVC-Predicted Pre % 78  FVC-Post L 2.46  FVC-Predicted Post % 78  Pre FEV1/FVC % % 87  Post FEV1/FCV % % 94  FEV1-Pre L 2.13  FEV1-Predicted Pre % 87  FEV1-Post L 2.31  DLCO uncorrected ml/min/mmHg 23.43  DLCO UNC% % 112  DLCO corrected ml/min/mmHg 22.07  DLCO COR %Predicted % 105  DLVA Predicted % 132  TLC L 4.02  TLC % Predicted % 85  RV % Predicted % 77    FeNO:   Pathology:   Echocardiogram:   Heart Catheterization:     Assessment & Plan:     ICD-10-CM   1. Chronic cough  R05.3 Ambulatory referral to ENT  2. Vocal cord dysfunction  J38.3   3. Class 3 severe obesity due to excess calories without serious comorbidity with body mass index (BMI) of 45.0 to 49.9 in adult [facility])  E66.01    Z68.42     Discussion:  This is a 59 year old female, obese, has had cough for a long time treated for various things over-the-counter cough suppressants has been on steroids in the past treated for GERD.  No significant change in her cough.  Her cough however seems to be very situational.  She comes emotional and the cough is much worse.  Also appears after history taken to have occurred or started after a significant life event.  It makes me question the diagnosis of VCD because it seems to be paroxysmal in nature.  Plan: Recommended referral to laryngologist.  This was placed to be seen by [facility]  ENT. She can continue her current treatments over-the-counter. I do not see a  clear reason to start any other treatments at this time. As for the occasional bowel and bladder incontinence I think she should follow-up with gynecology. Should follow-up with primary care regarding anxiety management.   Current Outpatient Medications:  .  chlorpheniramine (CHLOR-TRIMETON) 4 MG tablet, Take 1 tablet (4 mg total) by mouth at bedtime., Disp: 14 tablet, Rfl: 0 .  lovastatin (MEVACOR) 40 MG tablet, Take 40 mg by mouth daily., Disp: , Rfl:  .  triamterene-hydrochlorothiazide (DYAZIDE) 37.5-25 MG capsule, Take 1 capsule by mouth daily., Disp: , Rfl:  .  verapamil (CALAN-SR) 240 MG CR tablet, Take 240 mg by mouth daily., Disp: , Rfl:   I spent 45 minutes dedicated to the care of this patient on the date of this encounter to include pre-visit review of records, face-to-face time with the patient discussing conditions above, post visit ordering of testing, clinical documentation with the electronic health record, making appropriate referrals as documented, and communicating necessary findings to members of the patients care team.   Garner Nash, DO [facility] 11/13/2020 3:28 PM

## 2020-11-13 NOTE — Patient Instructions (Signed)
Thank you for visiting Dr. Valeta Harms at [facility]. Today we recommend the following:  Orders Placed This Encounter  Procedures  . Ambulatory referral to ENT   Return in about 3 months (around 02/11/2021) for with APP or Dr. Valeta Harms .    Please do your part to reduce the spread of COVID-19.

## 2021-08-25 IMAGING — MG DIGITAL DIAGNOSTIC UNILAT RIGHT W/ CAD
3 series · 3 of 3 positions shown · non-contrast
Comparison: Previous exam(s).

CLINICAL DATA: 58-year-old female presenting as a recall from
screening for right breast calcifications.

EXAM:
DIGITAL DIAGNOSTIC RIGHT MAMMOGRAM

[R ML (1 of 2)]
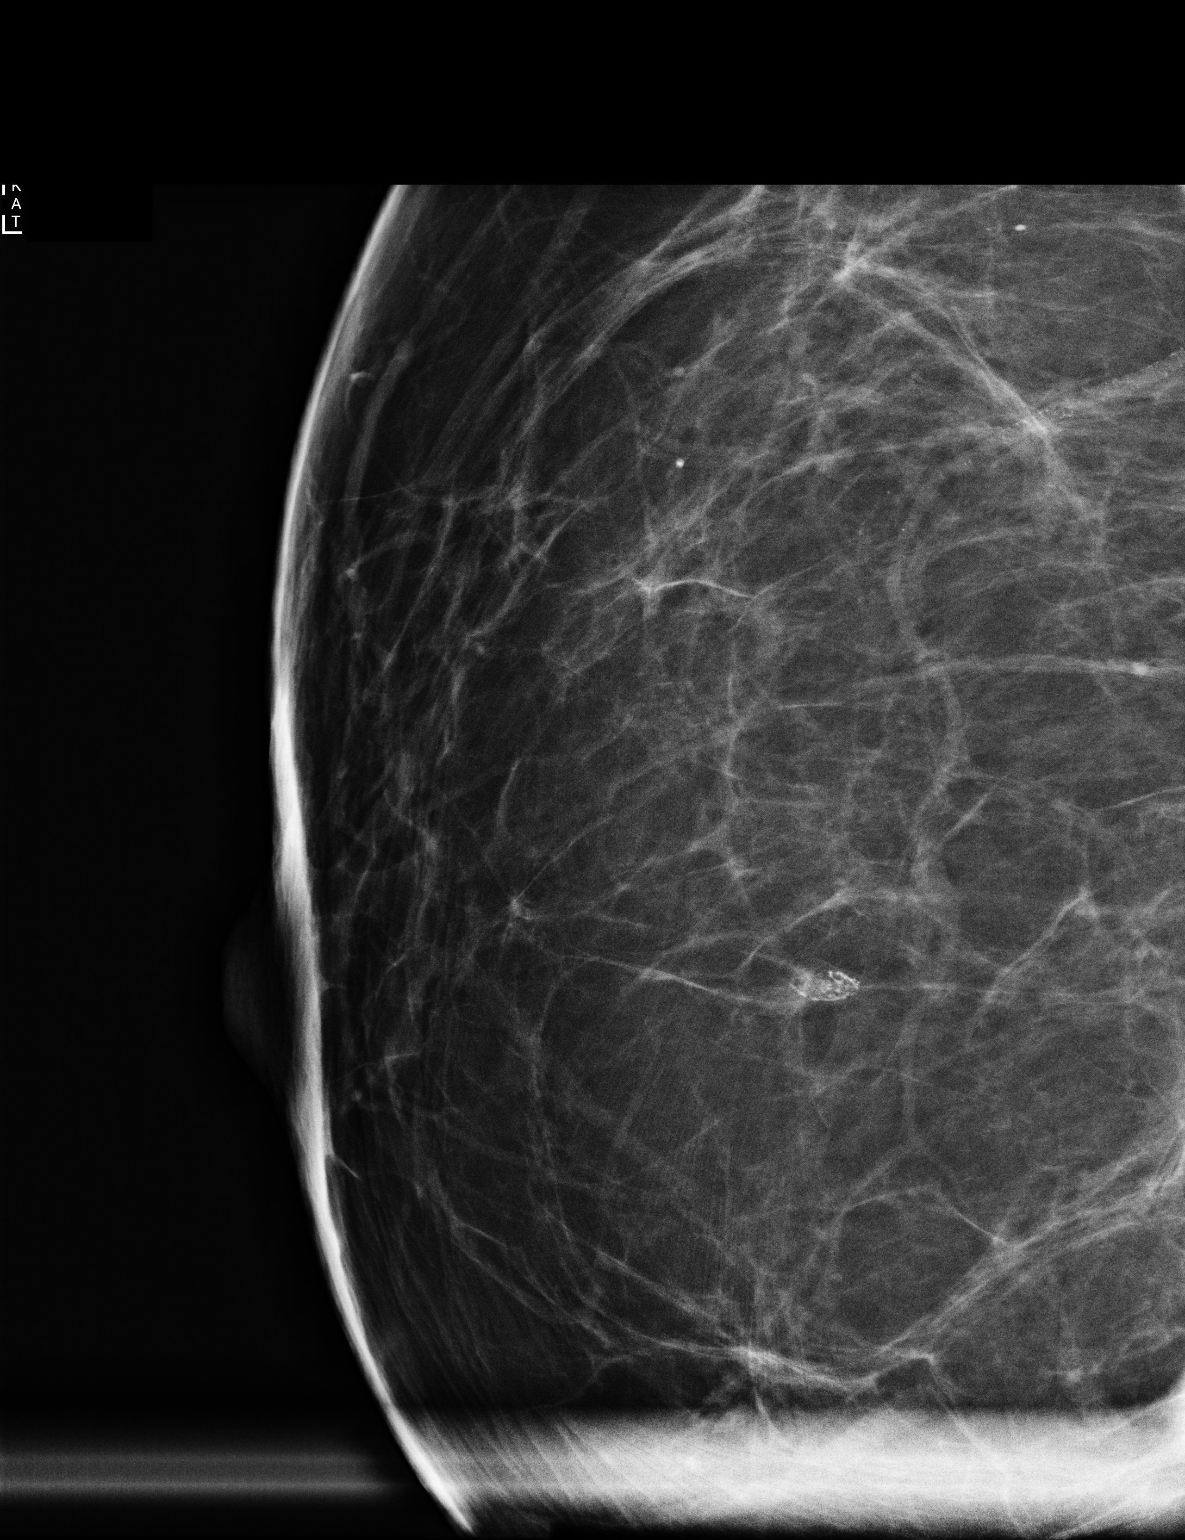

[R ML (2 of 2)]
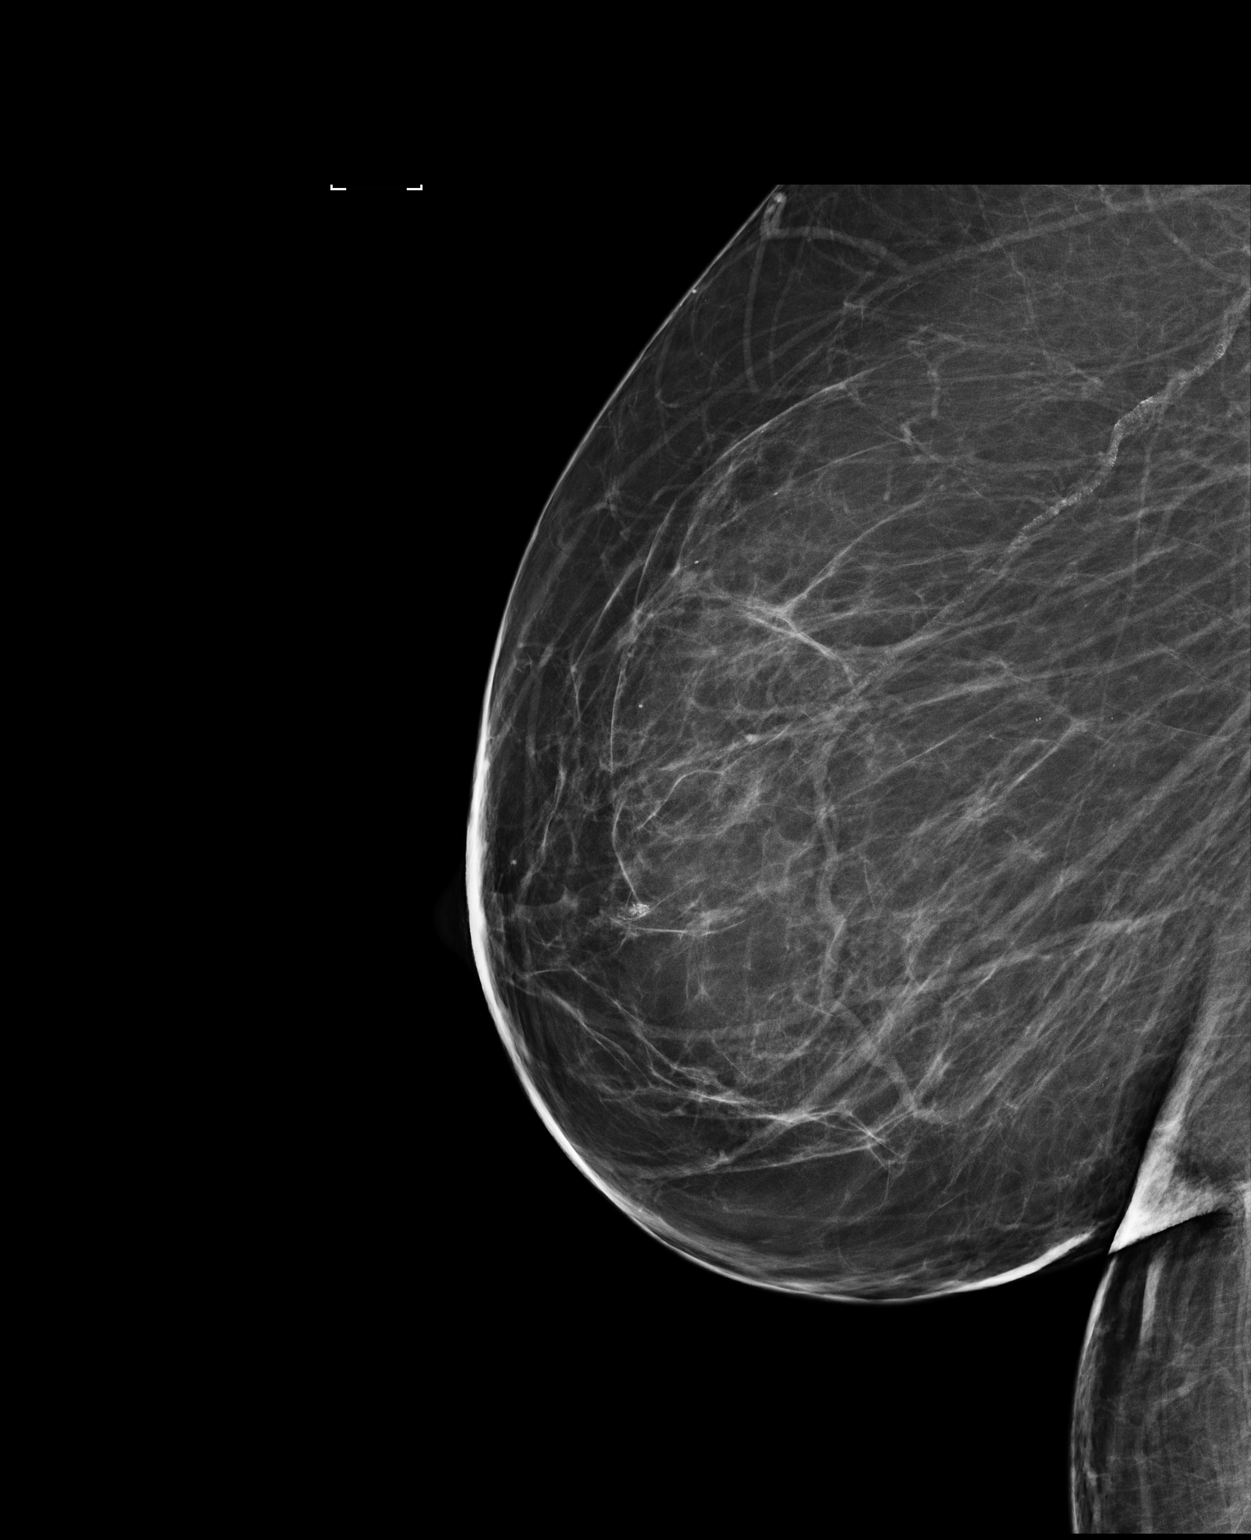

[R CC]
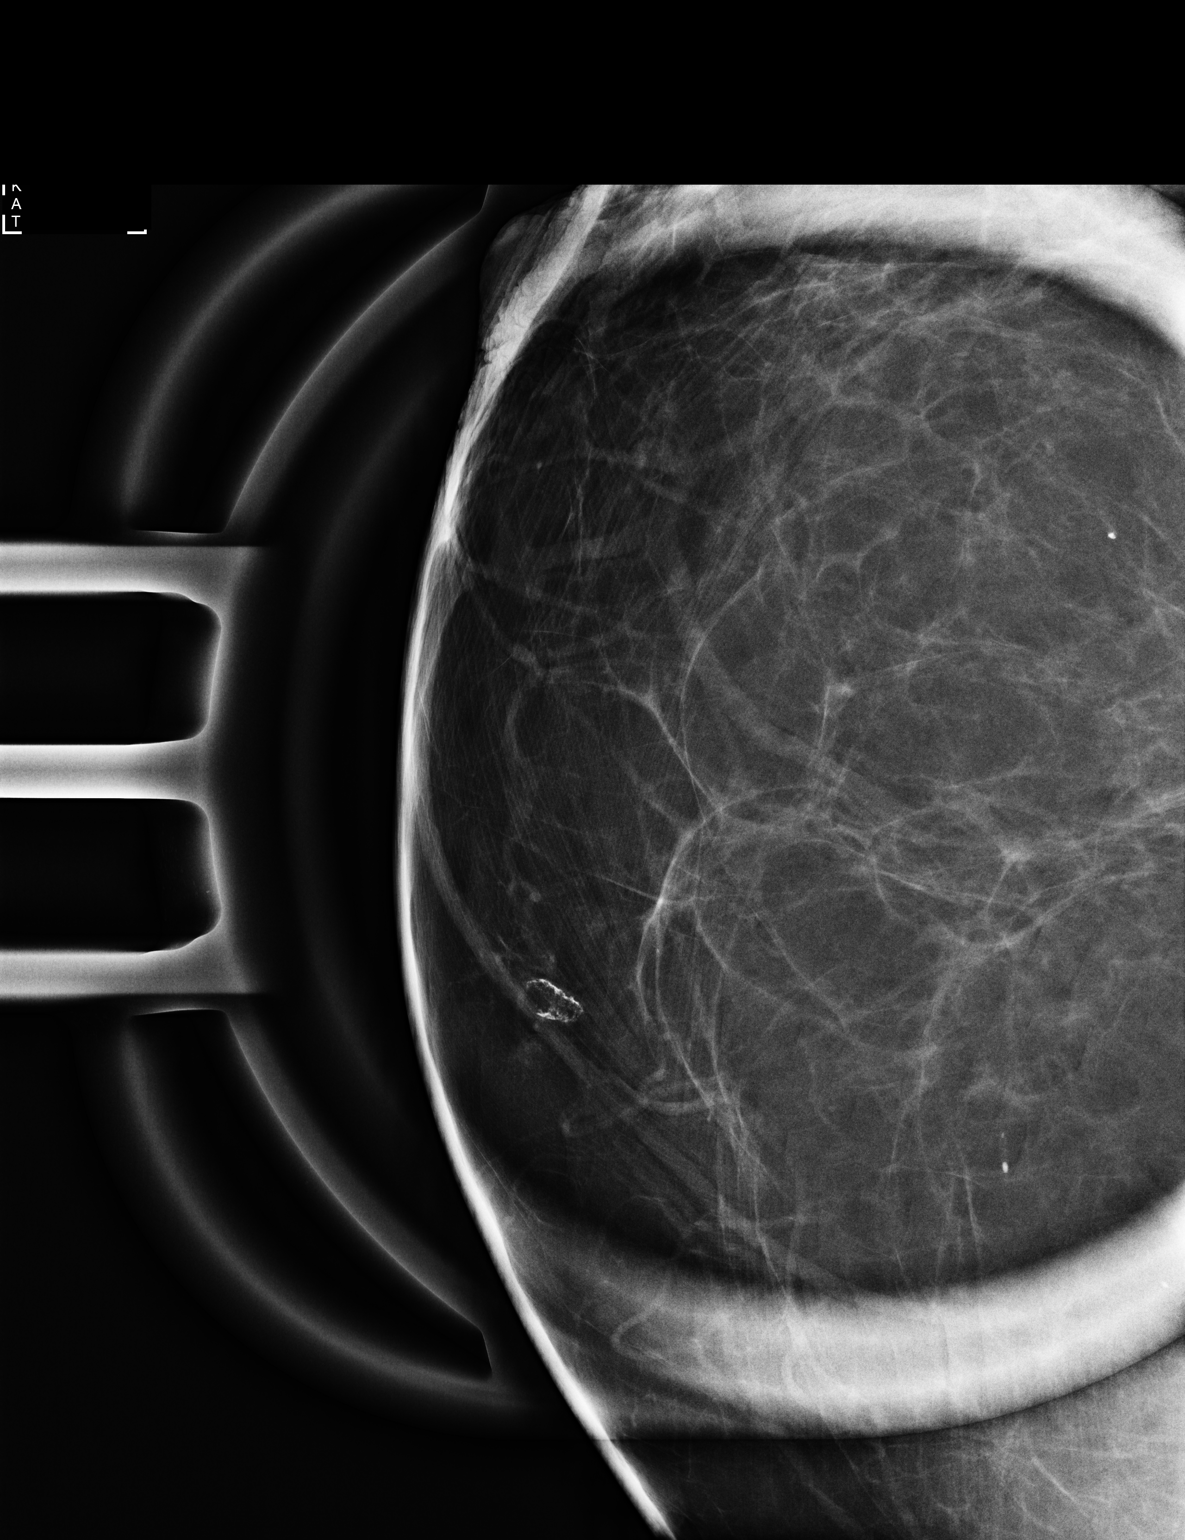

[3 of 3 positions shown; findings below may reference images not displayed]

ACR Breast Density Category b: There are scattered areas of
fibroglandular density.
FINDINGS: Spot magnification and full field mL views were performed for the
questioned calcifications in the medial right breast. On the spot
magnification views there are calcifications in a rim pattern with
central fat density, most consistent with benign fat necrosis. The
overall area spans 0.5 cm. There is no suspicious mass or
distortion.
IMPRESSION: Left breast calcifications medially are consistent with benign fat
necrosis.

RECOMMENDATION:
Screening mammogram in one year.(Code:N8-3-65B)

I have discussed the findings and recommendations with the patient.
If applicable, a reminder letter will be sent to the patient
regarding the next appointment.

BI-RADS CATEGORY  2: Benign.

## 2023-03-30 ENCOUNTER — Other Ambulatory Visit: Payer: Self-pay

## 2023-03-30 MED ORDER — TIRZEPATIDE 15 MG/0.5ML ~~LOC~~ SOAJ
15.0000 mg | SUBCUTANEOUS | 10 refills | Status: DC
  Filled 2023-03-30: qty 2, 28d supply, fill #0

## 2023-03-31 ENCOUNTER — Other Ambulatory Visit: Payer: Self-pay

## 2023-03-31 MED ORDER — MOUNJARO 15 MG/0.5ML ~~LOC~~ SOAJ
15.0000 mg | SUBCUTANEOUS | 10 refills | Status: AC
  Filled 2023-03-31 – 2023-05-31 (×4): qty 2, 28d supply, fill #0

## 2023-04-01 ENCOUNTER — Other Ambulatory Visit: Payer: Self-pay

## 2023-04-04 ENCOUNTER — Other Ambulatory Visit: Payer: Self-pay

## 2023-04-28 ENCOUNTER — Other Ambulatory Visit: Payer: Self-pay

## 2023-04-29 ENCOUNTER — Other Ambulatory Visit: Payer: Self-pay

## 2023-05-02 ENCOUNTER — Other Ambulatory Visit: Payer: Self-pay

## 2023-05-04 ENCOUNTER — Other Ambulatory Visit: Payer: Self-pay

## 2023-05-11 ENCOUNTER — Other Ambulatory Visit: Payer: Self-pay

## 2023-05-19 ENCOUNTER — Other Ambulatory Visit: Payer: Self-pay

## 2023-05-30 ENCOUNTER — Other Ambulatory Visit: Payer: Self-pay

## 2023-05-31 ENCOUNTER — Other Ambulatory Visit: Payer: Self-pay

## 2023-08-23 DIAGNOSIS — I10 Essential (primary) hypertension: Secondary | ICD-10-CM | POA: Insufficient documentation

## 2023-08-23 DIAGNOSIS — K449 Diaphragmatic hernia without obstruction or gangrene: Secondary | ICD-10-CM | POA: Insufficient documentation

## 2024-03-05 ENCOUNTER — Encounter: Payer: Self-pay | Admitting: Neurology
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
## 2024-03-06 ENCOUNTER — Encounter: Payer: Self-pay | Admitting: Licensed Clinical Social Worker

## 2024-03-06 ENCOUNTER — Ambulatory Visit (INDEPENDENT_AMBULATORY_CARE_PROVIDER_SITE_OTHER): Admitting: Neurology

## 2024-03-06 ENCOUNTER — Encounter: Payer: Self-pay | Admitting: Neurology

## 2024-03-06 VITALS — BP 124/76 | HR 76 | Ht 61.0 in | Wt 190.4 lb

## 2024-03-06 DIAGNOSIS — G20A1 Parkinson's disease without dyskinesia, without mention of fluctuations: Secondary | ICD-10-CM

## 2024-03-06 DIAGNOSIS — R87619 Unspecified abnormal cytological findings in specimens from cervix uteri: Secondary | ICD-10-CM | POA: Insufficient documentation

## 2024-03-06 DIAGNOSIS — R292 Abnormal reflex: Secondary | ICD-10-CM | POA: Diagnosis not present

## 2024-03-06 MED ORDER — CARBIDOPA-LEVODOPA 25-100 MG PO TABS: 1.0000 | ORAL_TABLET | Freq: Three times a day (TID) | ORAL | 1 refills | Status: DC

## 2024-03-06 NOTE — Patient Instructions (Addendum)
 Start Carbidopa Levodopa as follows: Take 1/2 tablet three times daily, at least 30 minutes before meals (approximately 7am/11am/4pm), for one week Then take 1/2 tablet in the morning, 1/2 tablet in the afternoon, 1 tablet in the evening, at least 30 minutes before meals, for one week Then take 1/2 tablet in the morning, 1 tablet in the afternoon, 1 tablet in the evening, at least 30 minutes before meals, for one week Then take 1 tablet three times daily at 7am/11am/4pm, at least 30 minutes before meals   As a reminder, carbidopa/levodopa can be taken at the same time as a carbohydrate, but we like to have you take your pill either 30 minutes before a protein source or 1 hour after as protein can interfere with carbidopa/levodopa absorption.   A referral to [facility] has been placed for your MRI someone will contact you directly to schedule your appt. They are located at [address] [facility]. Please contact them directly by calling [phone] with any questions regarding your referral.   Let us know if we can send a referral for physical therapy  It was good to see you!  The physicians and staff at [facility] are committed to providing excellent care. You may receive a survey requesting feedback about your experience at our office. We strive to receive "very good" responses to the survey questions. If you feel that your experience would prevent you from giving the office a "very good " response, please contact our office to try to remedy the situation. We may be reached at [phone]. Thank you for taking the time out of your busy day to complete the survey.

## 2024-03-18 ENCOUNTER — Ambulatory Visit
Admission: RE | Admit: 2024-03-18 | Discharge: 2024-03-18 | Disposition: A | Discharge status: Home / Self Care | Source: Ambulatory Visit | Attending: Neurology | Admitting: Neurology

## 2024-03-19 ENCOUNTER — Encounter: Payer: Self-pay | Admitting: Neurology

## 2024-03-23 ENCOUNTER — Encounter: Payer: Self-pay | Admitting: Neurology

## 2024-05-01 NOTE — Progress Notes (Unsigned)
 Assessment/Plan:   1.  Parkinsons Disease  -Increase carbidopa /levodopa  25/100, 1.5 tablet 3 times per day.  -add carbidopa /levodopa  50/200 at bed for nighttime foot tremor and first morning on  - Would continue to recommend PT/OT through the neurorehab center but she has 100% out of pocket payment right now so discussed alternatives and gave exercise programs, including those for free/low cost.  -We discussed that it used to be thought that levodopa  would increase risk of melanoma but now it is believed that Parkinsons itself likely increases risk of melanoma. she is to get regular skin checks.  She does see Dr. Rochelle Chu due to her history of BCC  -discussed how GI sx's/urologic sx's relate to Parkinsons Disease  2.  Constipation  -discussed nature and pathophysiology and association with PD  -discussed importance of hydration.  Pt is to increase water intake significantly  -pt is given a copy of the rancho recipe  -recommended daily colace  -recommended miralax prn   -GLP1 likely plays a role here   Subjective:   Danielle Rivera was seen today in follow up for Parkinsons disease.  My previous records were reviewed prior to todays visit as well as outside records available to me.  Patient was diagnosed last visit and started on levodopa .  She brings her boyfriend and daughter today and they supplement history.  She reports today that she is doing well except the L foot is curling in on her.  She is not sure when it happens in relationship to dosing.   She has some foot tremor in the middle of the night.  pt denies falls.  Pt denies lightheadedness, near syncope.  No hallucinations.  We did discuss starting physical/occupational therapy through our rehab program, but she had declined that last visit.  I did tell her to let me know if she changed her mind.  An MRI of the brain was done since last visit.  This was essentially unremarkable.  The patient did email me since last visit  requesting a DaTscan.  We told her that DaTscan is not really a part of the diagnostic criteria for identifying Parkinson's disease, although we do certainly use it sometimes in certain circumstances.  I did offer her, however, a second opinion, which was declined.  Current prescribed movement disorder medications: Carbidopa /levodopa  25/100, 1 tablet 3 times per day (started last visit)  ALLERGIES:   Allergies  Allergen Reactions   Codeine Nausea Only    CURRENT MEDICATIONS:  Current Meds  Medication Sig   carbidopa -levodopa  (SINEMET  IR) 25-100 MG tablet Take 1 tablet by mouth 3 (three) times daily. 7am/11am/4pm   lovastatin (ALTOPREV) 40 MG 24 hr tablet Take 40 mg by mouth at bedtime.   oxybutynin (DITROPAN-XL) 10 MG 24 hr tablet Take 10 mg by mouth daily.   tirzepatide  (MOUNJARO ) 15 MG/0.5ML Pen Inject 15 mg into the skin once a week.   triamterene-hydrochlorothiazide (DYAZIDE) 37.5-25 MG capsule Take 1 capsule by mouth daily.   verapamil (CALAN-SR) 240 MG CR tablet Take 240 mg by mouth daily.     Objective:   PHYSICAL EXAMINATION:    VITALS:   Vitals:   05/03/24 1323  BP: 120/70  Pulse: 78  SpO2: 98%  Weight: 181 lb 12.8 oz (82.5 kg)    GEN:  The patient appears stated age and is in NAD. HEENT:  Normocephalic, atraumatic.  The mucous membranes are moist. The superficial temporal arteries are without ropiness or tenderness. CV:  RRR Lungs:  CTAB  Neck/HEME:  There are no carotid bruits bilaterally.  Neurological examination:  Orientation: The patient is alert and oriented x3. Cranial nerves: There is good facial symmetry with facial hypomimia. The speech is fluent and clear. Soft palate rises symmetrically and there is no tongue deviation. Hearing is intact to conversational tone. Sensation: Sensation is intact to light touch throughout Motor: Strength is at least antigravity x4.  Movement examination: Tone: There is mild increased tone in the LUE only Abnormal  movements: there is L foot tremor, mild and intermittent Coordination:  There is mild decremation with finger taps on the L  Gait and Station: The patient pushes off to arise.  She is more upright but decreased arm swing on the L    Total time spent on today's visit was 60 minutes, including both face-to-face time and nonface-to-face time.  Time included that spent on review of records (prior notes available to me/labs/imaging if pertinent), discussing treatment and goals, answering patient's questions and coordinating care.  Cc:  Glena Landau, MD

## 2024-05-03 ENCOUNTER — Encounter: Payer: Self-pay | Admitting: Neurology

## 2024-05-03 ENCOUNTER — Ambulatory Visit: Admitting: Neurology

## 2024-05-03 VITALS — BP 120/70 | HR 78 | Wt 181.8 lb

## 2024-05-03 DIAGNOSIS — G20A1 Parkinson's disease without dyskinesia, without mention of fluctuations: Secondary | ICD-10-CM | POA: Diagnosis not present

## 2024-05-03 DIAGNOSIS — K5901 Slow transit constipation: Secondary | ICD-10-CM | POA: Diagnosis not present

## 2024-05-03 MED ORDER — CARBIDOPA-LEVODOPA 25-100 MG PO TABS: 1.5000 | ORAL_TABLET | Freq: Three times a day (TID) | ORAL | 1 refills | Status: DC

## 2024-05-03 MED ORDER — CARBIDOPA-LEVODOPA ER 50-200 MG PO TBCR: 1.0000 | EXTENDED_RELEASE_TABLET | Freq: Every day | ORAL | 1 refills | Status: DC

## 2024-05-03 NOTE — Patient Instructions (Addendum)
 Take carbidopa /levodopa  25/100, 1.5 tablets three times per day at 8am/noon/4pm Add carbidopa /levodopa  50/200 at bedtime  SAVE THE DATE!  We will hold a Parkinsons Disease symposium at the [facility] on 9/19.    Constipation and Parkinson's disease:  1.Rancho recipe for constipation in Parkinsons Disease:  -1 cup of unprocessed bran (need to get this at [employer], [employer] or similar type of store), 2 cups of applesauce in 1 cup of prune juice 2.  Increase fiber intake (Metamucil,vegetables) 3.  Regular, moderate exercise can be beneficial. 4.  Avoid medications causing constipation, such as medications like antacids with calcium or magnesium 5.  It's okay to take daily Miralax, and taper if stools become too loose or you experience diarrhea 6.  Stool softeners (Colace) can help with chronic constipation and I recommend you take this daily. 7.  Increase water intake.  You should be drinking 1/2 gallon of water a day as long as you have not been diagnosed with congestive heart failure or renal/kidney failure.  This is probably the single greatest thing that you can do to help your constipation.

## 2024-05-07 ENCOUNTER — Encounter: Payer: Self-pay | Admitting: Neurology

## 2024-06-26 ENCOUNTER — Ambulatory Visit: Admitting: Neurology

## 2024-10-19 ENCOUNTER — Other Ambulatory Visit: Payer: Self-pay | Admitting: Neurology

## 2024-11-13 NOTE — Progress Notes (Unsigned)
 Assessment/Plan:   1.  Parkinsons Disease  -continue carbidopa /levodopa  25/100, 1.5 tablet 3 times per day.  -continue carbidopa /levodopa  50/200 at bed for nighttime foot tremor and first morning on  - LONG discussion re: need to increase exercise  -We discussed that it used to be thought that levodopa  would increase risk of melanoma but now it is believed that Parkinsons itself likely increases risk of melanoma. she is to get regular skin checks.  She does see Dr. Joshua due to her history of BCC  -discussed how GI sx's/urologic sx's relate to Parkinsons Disease  2.  Constipation  -discussed nature and pathophysiology and association with PD  -discussed importance of hydration.  Pt is to increase water intake significantly  -pt is given a copy of the rancho recipe  -recommended daily colace  -recommended miralax prn   -GLP1 likely plays a role here  3.  Hypertension - On verapamil and triamterene/hydrochlorothiazide.  BP was low today.   Will need to watch these medications closely given propensity for Parkinsons disease to cause dysautonomia and asked her to f/u with pcp about that.   4.  DM -on mounjaro  and has some nausea and likely from this.  Is going to consider tapering this med in Jan  5. REM behavior disorder  -This is commonly associated with PD and the patient is experiencing this.  We discussed that this can be very serious and even harmful.  We talked about medications as well as physical barriers to put in the bed (particularly soft bed rails, pillow barriers).  We talked about moving the night stand so that it is not so close to the side of the bed.   -start melatonin 3 mg - 5 mg q hs   Subjective:   Danielle Rivera was seen today in follow up for Parkinsons disease.  My previous records were reviewed prior to todays visit as well as outside records available to me.  Husband/daughter present and supplement hx.  Patient's levodopa  was slightly increased last  visit and we added bedtime CR levodopa  for nighttime foot tremor and first morning on.  She reports today that this did help the foot tremor.  She is awakening at night due to urination and has trouble getting back to sleep.  No lightheadedness or dizziness.  Some nausea and on mounjaro .  Is not exercising.  Is  having active dreams and yells out.  No falling OOB.  Current prescribed movement disorder medications: Carbidopa /levodopa  25/100, 1.5 tablet 3 times per day (increased) Carbidopa /levodopa  50/200 CR at bedtime (started last visit)  ALLERGIES:   Allergies  Allergen Reactions   Codeine Nausea Only    CURRENT MEDICATIONS:  Current Meds  Medication Sig   carbidopa -levodopa  (SINEMET  CR) 50-200 MG tablet TAKE 1 TABLET BY MOUTH AT BEDTIME   carbidopa -levodopa  (SINEMET  IR) 25-100 MG tablet Take 1.5 tablets by mouth 3 (three) times daily. 8am/noon/4pm   lovastatin (ALTOPREV) 40 MG 24 hr tablet Take 40 mg by mouth at bedtime.   oxybutynin (DITROPAN-XL) 10 MG 24 hr tablet Take 10 mg by mouth daily.   tirzepatide  (MOUNJARO ) 15 MG/0.5ML Pen Inject 15 mg into the skin once a week.   triamterene-hydrochlorothiazide (DYAZIDE) 37.5-25 MG capsule Take 1 capsule by mouth daily.   verapamil (CALAN-SR) 240 MG CR tablet Take 240 mg by mouth daily.     Objective:   PHYSICAL EXAMINATION:    VITALS:   Vitals:   11/15/24 0907  BP: 112/60  Pulse: 61  SpO2: 98%  Weight: 174 lb 12.8 oz (79.3 kg)     GEN:  The patient appears stated age and is in NAD. HEENT:  Normocephalic, atraumatic.  The mucous membranes are moist. The superficial temporal arteries are without ropiness or tenderness. CV:  RRR Lungs:  CTAB Neck/HEME:  There are no carotid bruits bilaterally.  Neurological examination:  Orientation: The patient is alert and oriented x3. Cranial nerves: There is good facial symmetry with facial hypomimia. The speech is fluent and clear. Soft palate rises symmetrically and there is no tongue  deviation. Hearing is intact to conversational tone. Sensation: Sensation is intact to light touch throughout Motor: Strength is at least antigravity x4.  Movement examination: Tone: There is mild increased tone in the LUE only Abnormal movements: no tremor.  Mild dyskinesia of the L foot Coordination:  There is no decremation with any form of RAMS, including alternating supination and pronation of the forearm, hand opening and closing, finger taps, heel taps and toe taps.  Gait and Station: The patient pushes off to arise.  She is more upright but decreased arm swing on the L    Total time spent on today's visit was 30 minutes, including both face-to-face time and nonface-to-face time.  Time included that spent on review of records (prior notes available to me/labs/imaging if pertinent), discussing treatment and goals, answering patient's questions and coordinating care.  Cc:  Loreli Kins, MD

## 2024-11-15 ENCOUNTER — Ambulatory Visit: Admitting: Neurology

## 2024-11-15 ENCOUNTER — Encounter: Payer: Self-pay | Admitting: Neurology

## 2024-11-15 VITALS — BP 112/60 | HR 61 | Wt 174.8 lb

## 2024-11-15 DIAGNOSIS — G4752 REM sleep behavior disorder: Secondary | ICD-10-CM

## 2024-11-15 DIAGNOSIS — G20B1 Parkinson's disease with dyskinesia, without mention of fluctuations: Secondary | ICD-10-CM | POA: Diagnosis not present

## 2024-11-15 DIAGNOSIS — R110 Nausea: Secondary | ICD-10-CM | POA: Diagnosis not present

## 2024-11-15 MED ORDER — CARBIDOPA-LEVODOPA 25-100 MG PO TABS: 1.5000 | ORAL_TABLET | Freq: Three times a day (TID) | ORAL | 1 refills | Status: AC

## 2024-11-15 NOTE — Patient Instructions (Signed)
°  VISIT SUMMARY: Today, we discussed the management of your Parkinson's disease and evaluated your sleep disturbances. We reviewed your current medications and their effects, including your blood pressure and nausea. We also talked about your recent increase in nightmares and sleep talking.  YOUR PLAN: -PARKINSON'S DISEASE: Parkinson's disease is a disorder of the nervous system that affects movement. You will continue with your current carbidopa -levodopa  regimen. Please monitor your blood pressure at home and report to your primary care doctor if it remains consistently low. Regular exercise is important, so aim for 150 minutes of moderate cardiovascular activity per week. We also discussed your nausea, which may be related to your diabetes medication, and you should talk to your prescribing provider about this.  -REM SLEEP BEHAVIOR DISORDER: REM sleep behavior disorder is a condition where you act out your dreams, which can sometimes lead to injury. To help manage this, ensure your bedroom is safe by removing hazards and using bed rails. Start taking melatonin 3-5 mg at bedtime. If melatonin does not help, we may consider prescription medication.  INSTRUCTIONS: Please monitor your blood pressure at home and report to your primary care doctor if it remains consistently low. Aim for 150 minutes of moderate cardiovascular activity per week. Discuss your nausea with your prescribing provider of the mounjaro . Start taking melatonin 3-5 mg at bedtime for your sleep disturbances. If melatonin does not help, we may consider prescription medication.                      Contains text generated by [employer].                                 Contains text generated by [employer].

## 2025-05-21 ENCOUNTER — Ambulatory Visit: Payer: Self-pay | Admitting: Neurology
